# Patient Record
Sex: Female | Born: 1970 | Race: White | Hispanic: No | Marital: Married | Smoking: Former smoker
Health system: Southern US, Community
[De-identification: ages and names within clinical notes are randomized; demographics above are authoritative.]

## PROBLEM LIST (undated history)

## (undated) DIAGNOSIS — Z0000 Encounter for general adult medical examination without abnormal findings: Secondary | ICD-10-CM

## (undated) HISTORY — DX: Encounter for general adult medical examination without abnormal findings: Z00.00

## (undated) HISTORY — PX: AUGMENTATION MAMMAPLASTY: SUR837

## (undated) HISTORY — PX: WISDOM TOOTH EXTRACTION: SHX21

---

## 2000-02-10 ENCOUNTER — Other Ambulatory Visit: Admission: RE | Admit: 2000-02-10 | Discharge: 2000-02-10 | Payer: Self-pay | Admitting: Obstetrics & Gynecology

## 2000-08-27 ENCOUNTER — Inpatient Hospital Stay (HOSPITAL_COMMUNITY): Admission: AD | Admit: 2000-08-27 | Discharge: 2000-08-29 | Payer: Self-pay | Admitting: Obstetrics & Gynecology

## 2000-10-01 ENCOUNTER — Other Ambulatory Visit: Admission: RE | Admit: 2000-10-01 | Discharge: 2000-10-01 | Payer: Self-pay | Admitting: Obstetrics & Gynecology

## 2001-10-21 ENCOUNTER — Other Ambulatory Visit: Admission: RE | Admit: 2001-10-21 | Discharge: 2001-10-21 | Payer: Self-pay | Admitting: Obstetrics & Gynecology

## 2002-05-25 ENCOUNTER — Other Ambulatory Visit: Admission: RE | Admit: 2002-05-25 | Discharge: 2002-05-25 | Payer: Self-pay | Admitting: Obstetrics & Gynecology

## 2003-01-05 ENCOUNTER — Inpatient Hospital Stay (HOSPITAL_COMMUNITY): Admission: AD | Admit: 2003-01-05 | Discharge: 2003-01-07 | Payer: Self-pay | Admitting: Obstetrics & Gynecology

## 2003-02-10 ENCOUNTER — Other Ambulatory Visit: Admission: RE | Admit: 2003-02-10 | Discharge: 2003-02-10 | Payer: Self-pay | Admitting: Obstetrics & Gynecology

## 2013-02-20 ENCOUNTER — Ambulatory Visit (INDEPENDENT_AMBULATORY_CARE_PROVIDER_SITE_OTHER): Payer: BC Managed Care – PPO | Admitting: *Deleted

## 2013-02-20 DIAGNOSIS — Z23 Encounter for immunization: Secondary | ICD-10-CM

## 2016-12-05 ENCOUNTER — Ambulatory Visit (INDEPENDENT_AMBULATORY_CARE_PROVIDER_SITE_OTHER): Payer: BC Managed Care – PPO | Admitting: Obstetrics & Gynecology

## 2016-12-05 ENCOUNTER — Encounter: Payer: Self-pay | Admitting: Obstetrics & Gynecology

## 2016-12-05 VITALS — BP 124/82 | Ht 66.75 in | Wt 153.0 lb

## 2016-12-05 DIAGNOSIS — Z30431 Encounter for routine checking of intrauterine contraceptive device: Secondary | ICD-10-CM | POA: Diagnosis not present

## 2016-12-05 DIAGNOSIS — Z01419 Encounter for gynecological examination (general) (routine) without abnormal findings: Secondary | ICD-10-CM

## 2016-12-05 DIAGNOSIS — Z1151 Encounter for screening for human papillomavirus (HPV): Secondary | ICD-10-CM

## 2016-12-05 DIAGNOSIS — N898 Other specified noninflammatory disorders of vagina: Secondary | ICD-10-CM | POA: Diagnosis not present

## 2016-12-05 LAB — WET PREP FOR TRICH, YEAST, CLUE
Clue Cells Wet Prep HPF POC: NONE SEEN
Trich, Wet Prep: NONE SEEN
Yeast Wet Prep HPF POC: NONE SEEN

## 2016-12-05 NOTE — Addendum Note (Signed)
Addended by: Thurnell Garbe A on: 12/05/2016 11:57 AM   Modules accepted: Orders

## 2016-12-05 NOTE — Progress Notes (Signed)
Ashley Woodward 07/24/1970 [id number]   History:    46 y.o. G5P3A2 married.  Husband DM/Thrombophilia, had lower limb amputation.  Children Youngest 42, middle is a Paramedic in [employer], oldest in Nursing school.  RP:  Established patient presenting for annual gyn exam   HPI:  Well on Mirena IUD x 3 years.  No pelvic pain.  C/O mild increase in vaginal d/c with occasional odor and itching.  Not sexually active lately because husband has many medical issues.  Stress ++.  Breasts wnl.  Mictions/BMs wnl.  Past medical history,surgical history, family history and social history were all reviewed and documented in the EPIC chart.  Gynecologic History Patient's last menstrual period was 11/04/2016. Contraception: IUD Last Pap: 2015. Results were: normal Last mammogram: 2015. Results were: normal  Obstetric History OB History  Gravida Para Term Preterm AB Living  5 3     2 3   SAB TAB Ectopic Multiple Live Births               # Outcome Date GA Lbr Len/2nd Weight Sex Delivery Anes PTL Lv  5 AB           4 AB           3 Para           2 Para           1 Para                ROS: A ROS was performed and pertinent positives and negatives are included in the history.  GENERAL: No fevers or chills. HEENT: No change in vision, no earache, sore throat or sinus congestion. NECK: No pain or stiffness. CARDIOVASCULAR: No chest pain or pressure. No palpitations. PULMONARY: No shortness of breath, cough or wheeze. GASTROINTESTINAL: No abdominal pain, nausea, vomiting or diarrhea, melena or bright red blood per rectum. GENITOURINARY: No urinary frequency, urgency, hesitancy or dysuria. MUSCULOSKELETAL: No joint or muscle pain, no back pain, no recent trauma. DERMATOLOGIC: No rash, no itching, no lesions. ENDOCRINE: No polyuria, polydipsia, no heat or cold intolerance. No recent change in weight. HEMATOLOGICAL: No anemia or easy bruising or bleeding. NEUROLOGIC: No headache, seizures, numbness, tingling or  weakness. PSYCHIATRIC: No depression, no loss of interest in normal activity or change in sleep pattern.     Exam:   BP 124/82   Ht 5' 6.75" (1.695 m)   Wt 153 lb (69.4 kg)   LMP 11/04/2016 Comment: MIRENA IUD   BMI 24.14 kg/m   Body mass index is 24.14 kg/m.  General appearance : Well developed well nourished female. No acute distress HEENT: Eyes: no retinal hemorrhage or exudates,  Neck supple, trachea midline, no carotid bruits, no thyroidmegaly Lungs: Clear to auscultation, no rhonchi or wheezes, or rib retractions  Heart: Regular rate and rhythm, no murmurs or gallops Breast:Examined in sitting and supine position were symmetrical in appearance, no palpable masses or tenderness,  no skin retraction, no nipple inversion, no nipple discharge, no skin discoloration, no axillary or supraclavicular lymphadenopathy Abdomen: no palpable masses or tenderness, no rebound or guarding Extremities: no edema or skin discoloration or tenderness  Pelvic: Vulva normal.  Bartholin, Urethra, Skene Glands: Within normal limits             Vagina: No gross lesions.  Mild increase in discharge.  Wet prep done.  Cervix: No gross lesions or discharge.  Strings seen, good position. Pap/HPV done.  Uterus  AV, normal size, shape  and consistency, non-tender and mobile  Adnexa  Without masses or tenderness  Anus and perineum  normal     Assessment/Plan:  46 y.o. female for annual exam   1. Vaginal discharge Wet prep done, negative.  Reassured.  Will call for evaluation/treatment if Sxs of Yeasts or Bacterial Vaginosis.  2. Encounter for gynecological examination with Pap/HPV testing Gyn exam normal.  Wet prep neg.  Pap/HPV done.  Breasts wnl.  Schedule screening Mammo.  3. Encounter for routine checking of intrauterine contraceptive device (IUD) Well on Mirena IUD x 3 years, in good position.  Princess Bruins MD, 10:08 AM 12/05/2016

## 2016-12-05 NOTE — Addendum Note (Signed)
Addended by: Thurnell Garbe A on: 12/05/2016 11:54 AM   Modules accepted: Orders

## 2016-12-05 NOTE — Patient Instructions (Signed)
1. Encounter for gynecological examination with Pap/HPV testing Gyn exam normal.  Wet prep neg.  Pap/HPV done.  Breasts wnl.  Schedule screening Mammo.  2.  Encounter for routine checking of intrauterine contraceptive device (IUD) Well on Mirena IUD x 3 years, in good position.  3.  Vaginal discharge Wet prep done, negative.  Reassured.  Will call for evaluation/treatment if Sxs of Yeasts or Bacterial Vaginosis.  Raeann, it was a pleasure to see you today!  I will inform you of your results as soon as available.   Vaginitis Vaginitis is a condition in which the vaginal tissue swells and becomes red (inflamed). This condition is most often caused by a change in the normal balance of bacteria and yeast that live in the vagina. This change causes an overgrowth of certain bacteria or yeast, which causes the inflammation. There are different types of vaginitis, but the most common types are:  Bacterial vaginosis.  Yeast infection (candidiasis).  Trichomoniasis vaginitis. This is a sexually transmitted disease (STD).  Viral vaginitis.  Atrophic vaginitis.  Allergic vaginitis.  What are the causes? The cause of this condition depends on the type of vaginitis. It can be caused by:  Bacteria (bacterial vaginosis).  Yeast, which is a fungus (yeast infection).  A parasite (trichomoniasis vaginitis).  A virus (viral vaginitis).  Low hormone levels (atrophic vaginitis). Low hormone levels can occur during pregnancy, breastfeeding, or after menopause.  Irritants, such as bubble baths, scented tampons, and feminine sprays (allergic vaginitis).  Other factors can change the normal balance of the yeast and bacteria that live in the vagina. These include:  Antibiotic medicines.  Poor hygiene.  Diaphragms, vaginal sponges, spermicides, birth control pills, and intrauterine devices (IUD).  Sex.  Infection.  Uncontrolled diabetes.  A weakened defense (immune) system.  What  increases the risk? This condition is more likely to develop in women who:  Smoke.  Use vaginal douches, scented tampons, or scented sanitary pads.  Wear tight-fitting pants.  Wear thong underwear.  Use oral birth control pills or an IUD.  Have sex without a condom.  Have multiple sex partners.  Have an STD.  Frequently use the spermicide nonoxynol-9.  Eat lots of foods high in sugar.  Have uncontrolled diabetes.  Have low estrogen levels.  Have a weakened immune system from an immune disorder or medical treatment.  Are pregnant or breastfeeding.  What are the signs or symptoms? Symptoms vary depending on the cause of the vaginitis. Common symptoms include:  Abnormal vaginal discharge. ? The discharge is white, gray, or yellow with bacterial vaginosis. ? The discharge is thick, white, and cheesy with a yeast infection. ? The discharge is frothy and yellow or greenish with trichomoniasis.  A bad vaginal smell. The smell is fishy with bacterial vaginosis.  Vaginal itching, pain, or swelling.  Sex that is painful.  Pain or burning when urinating.  Sometimes there are no symptoms. How is this diagnosed? This condition is diagnosed based on your symptoms and medical history. A physical exam, including a pelvic exam, will also be done. You may also have other tests, including:  Tests to determine the pH level (acidity or alkalinity) of your vagina.  A whiff test, to assess the odor that results when a sample of your vaginal discharge is mixed with a potassium hydroxide solution.  Tests of vaginal fluid. A sample will be examined under a microscope.  How is this treated? Treatment varies depending on the type of vaginitis you have. Your treatment  may include:  Antibiotic creams or pills to treat bacterial vaginosis and trichomoniasis.  Antifungal medicines, such as vaginal creams or suppositories, to treat a yeast infection.  Medicine to ease discomfort if you  have viral vaginitis. Your sexual partner should also be treated.  Estrogen delivered in a cream, pill, suppository, or vaginal ring to treat atrophic vaginitis. If vaginal dryness occurs, lubricants and moisturizing creams may help. You may need to avoid scented soaps, sprays, or douches.  Stopping use of a product that is causing allergic vaginitis. Then using a vaginal cream to treat the symptoms.  Follow these instructions at home: Lifestyle  Keep your genital area clean and dry. Avoid soap, and only rinse the area with water.  Do not douche or use tampons until your health care provider says it is okay to do so. Use sanitary pads, if needed.  Do not have sex until your health care provider approves. When you can return to sex, practice safe sex and use condoms.  Wipe from front to back. This avoids the spread of bacteria from the rectum to the vagina. General instructions  Take over-the-counter and prescription medicines only as told by your health care provider.  If you were prescribed an antibiotic medicine, take or use it as told by your health care provider. Do not stop taking or using the antibiotic even if you start to feel better.  Keep all follow-up visits as told by your health care provider. This is important. How is this prevented?  Use mild, non-scented products. Do not use things that can irritate the vagina, such as fabric softeners. Avoid the following products if they are scented: ? Feminine sprays. ? Detergents. ? Tampons. ? Feminine hygiene products. ? Soaps or bubble baths.  Let air reach your genital area. ? Wear cotton underwear to reduce moisture buildup. ? Avoid wearing underwear while you sleep. ? Avoid wearing tight pants and underwear or nylons without a cotton panel. ? Avoid wearing thong underwear.  Take off any wet clothing, such as bathing suits, as soon as possible.  Practice safe sex and use condoms. Contact a health care provider  if:  You have abdominal pain.  You have a fever.  You have symptoms that last for more than 2-3 days. Get help right away if:  You have a fever and your symptoms suddenly get worse. Summary  Vaginitis is a condition in which the vaginal tissue becomes inflamed.This condition is most often caused by a change in the normal balance of bacteria and yeast that live in the vagina.  Treatment varies depending on the type of vaginitis you have.  Do not douche, use tampons , or have sex until your health care provider approves. When you can return to sex, practice safe sex and use condoms. This information is not intended to replace advice given to you by your health care provider. Make sure you discuss any questions you have with your health care provider. Document Released: 02/16/2007 Document Revised: 05/27/2016 Document Reviewed: 05/27/2016 Elsevier Interactive Patient Education  [employer].

## 2016-12-09 LAB — PAP, TP IMAGING W/ HPV RNA, RFLX HPV TYPE 16,18/45: HPV mRNA, High Risk: NOT DETECTED

## 2018-02-18 ENCOUNTER — Encounter: Payer: Self-pay | Admitting: Obstetrics & Gynecology

## 2018-02-18 ENCOUNTER — Ambulatory Visit (INDEPENDENT_AMBULATORY_CARE_PROVIDER_SITE_OTHER): Payer: BC Managed Care – PPO | Admitting: Obstetrics & Gynecology

## 2018-02-18 ENCOUNTER — Other Ambulatory Visit: Payer: Self-pay | Admitting: Obstetrics & Gynecology

## 2018-02-18 VITALS — BP 118/76 | Ht 66.0 in | Wt 146.0 lb

## 2018-02-18 DIAGNOSIS — Z30431 Encounter for routine checking of intrauterine contraceptive device: Secondary | ICD-10-CM | POA: Diagnosis not present

## 2018-02-18 DIAGNOSIS — Z01419 Encounter for gynecological examination (general) (routine) without abnormal findings: Secondary | ICD-10-CM

## 2018-02-18 DIAGNOSIS — Z1231 Encounter for screening mammogram for malignant neoplasm of breast: Secondary | ICD-10-CM

## 2018-02-18 NOTE — Progress Notes (Signed)
Ashley Woodward 1970/06/29 [id number]   History:    47 y.o. G5P3A2L3 Married.  Children 24 yo son, 83 yo son and 85 yo daughter  RP:  Established patient presenting for annual gyn exam   HPI: Well on Mirena IUD since April 2016.  No breakthrough bleeding.  No pelvic pain.  No pain with intercourse.  Normal vaginal secretions.  Urine and bowel movements normal.  Breasts normal, status post bilateral breast augmentation.  Body mass index 23.57. Fit and healthy nutrition.  Health labs with family physician.  Past medical history,surgical history, family history and social history were all reviewed and documented in the EPIC chart.  Gynecologic History Patient's last menstrual period was 02/14/2018. Contraception: Mirena IUD x 08/2014 Last Pap: 2018. Results were:  Negative, HPV HR negative Last mammogram: 2016. Results were: normal Bone Density: Never Colonoscopy: Never  Obstetric History OB History  Gravida Para Term Preterm AB Living  5 3     2 3   SAB TAB Ectopic Multiple Live Births               # Outcome Date GA Lbr Len/2nd Weight Sex Delivery Anes PTL Lv  5 AB           4 AB           3 Para           2 Para           1 Para              ROS: A ROS was performed and pertinent positives and negatives are included in the history.  GENERAL: No fevers or chills. HEENT: No change in vision, no earache, sore throat or sinus congestion. NECK: No pain or stiffness. CARDIOVASCULAR: No chest pain or pressure. No palpitations. PULMONARY: No shortness of breath, cough or wheeze. GASTROINTESTINAL: No abdominal pain, nausea, vomiting or diarrhea, melena or bright red blood per rectum. GENITOURINARY: No urinary frequency, urgency, hesitancy or dysuria. MUSCULOSKELETAL: No joint or muscle pain, no back pain, no recent trauma. DERMATOLOGIC: No rash, no itching, no lesions. ENDOCRINE: No polyuria, polydipsia, no heat or cold intolerance. No recent change in weight. HEMATOLOGICAL: No anemia or  easy bruising or bleeding. NEUROLOGIC: No headache, seizures, numbness, tingling or weakness. PSYCHIATRIC: No depression, no loss of interest in normal activity or change in sleep pattern.     Exam:   BP 118/76   Ht 5\' 6"  (1.676 m)   Wt 146 lb (66.2 kg)   LMP 02/14/2018 Comment: mirena  BMI 23.57 kg/m   Body mass index is 23.57 kg/m.  General appearance : Well developed well nourished female. No acute distress HEENT: Eyes: no retinal hemorrhage or exudates,  Neck supple, trachea midline, no carotid bruits, no thyroidmegaly Lungs: Clear to auscultation, no rhonchi or wheezes, or rib retractions  Heart: Regular rate and rhythm, no murmurs or gallops Breast:Examined in sitting and supine position were symmetrical in appearance, no palpable masses or tenderness,  no skin retraction, no nipple inversion, no nipple discharge, no skin discoloration, no axillary or supraclavicular lymphadenopathy Abdomen: no palpable masses or tenderness, no rebound or guarding Extremities: no edema or skin discoloration or tenderness  Pelvic: Vulva: Normal             Vagina: No gross lesions or discharge  Cervix: No gross lesions or discharge.  IUD strings visible.  Pap reflex done  Uterus  AV, normal size, shape and consistency, non-tender and mobile  Adnexa  Without masses or tenderness  Anus: Normal   Assessment/Plan:  47 y.o. female for annual exam   1. Encounter for routine gynecological examination with Papanicolaou smear of cervix Normal gynecologic exam.  Pap reflex done.  Breast exam normal.  Overdue for screening mammogram, will schedule at the breast center.  Health labs with family physician.  Good body mass index of 23.57.  Continue with healthy nutrition and fitness.  2. Encounter for routine checking of intrauterine contraceptive device (IUD) Well on Mirena IUD since April 2016.  Well-tolerated in good position.  Princess Bruins MD, 4:13 PM 02/18/2018

## 2018-02-19 ENCOUNTER — Encounter: Payer: Self-pay | Admitting: Obstetrics & Gynecology

## 2018-02-19 NOTE — Patient Instructions (Signed)
1. Encounter for routine gynecological examination with Papanicolaou smear of cervix Normal gynecologic exam.  Pap reflex done.  Breast exam normal.  Overdue for screening mammogram, will schedule at the breast center.  Health labs with family physician.  Good body mass index of 23.57.  Continue with healthy nutrition and fitness.  2. Encounter for routine checking of intrauterine contraceptive device (IUD) Well on Mirena IUD since April 2016.  Well-tolerated in good position.  Ashley Woodward, it was a pleasure seeing you today!  I will inform you of your results as soon as they are available.

## 2018-02-22 LAB — PAP IG W/ RFLX HPV ASCU

## 2018-03-31 ENCOUNTER — Ambulatory Visit
Admission: RE | Admit: 2018-03-31 | Discharge: 2018-03-31 | Disposition: A | Discharge status: Home / Self Care | Payer: BC Managed Care – PPO | Source: Ambulatory Visit | Attending: Obstetrics & Gynecology | Admitting: Obstetrics & Gynecology

## 2018-03-31 DIAGNOSIS — Z1231 Encounter for screening mammogram for malignant neoplasm of breast: Secondary | ICD-10-CM

## 2018-05-10 ENCOUNTER — Encounter: Payer: BC Managed Care – PPO | Admitting: Obstetrics & Gynecology

## 2018-12-13 ENCOUNTER — Ambulatory Visit
Admission: RE | Admit: 2018-12-13 | Discharge: 2018-12-13 | Disposition: A | Discharge status: Home / Self Care | Payer: No Typology Code available for payment source | Source: Ambulatory Visit | Attending: Nurse Practitioner | Admitting: Nurse Practitioner

## 2018-12-13 ENCOUNTER — Other Ambulatory Visit: Payer: Self-pay | Admitting: Nurse Practitioner

## 2018-12-13 DIAGNOSIS — Z021 Encounter for pre-employment examination: Secondary | ICD-10-CM

## 2018-12-13 LAB — CBC AND DIFFERENTIAL
HCT: 43 (ref 36–46)
Hemoglobin: 13.8 (ref 12.0–16.0)
Neutrophils Absolute: 4
WBC: 6.7

## 2018-12-13 LAB — LIPID PANEL
Cholesterol: 190 (ref 0–200)
Triglycerides: 61 (ref 40–160)

## 2018-12-13 LAB — HEPATIC FUNCTION PANEL
ALT: 13 (ref 7–35)
AST: 13 (ref 13–35)
Bilirubin, Total: 0.6

## 2018-12-13 LAB — BASIC METABOLIC PANEL
BUN: 8 (ref 4–21)
Creatinine: 0.7 (ref 0.5–1.1)
Glucose: 93
Potassium: 4.1 (ref 3.4–5.3)
Sodium: 140 (ref 137–147)

## 2018-12-13 LAB — TSH: TSH: 1.31 (ref 0.41–5.90)

## 2018-12-23 ENCOUNTER — Ambulatory Visit: Payer: BC Managed Care – PPO | Admitting: Family Medicine

## 2018-12-23 ENCOUNTER — Encounter: Payer: Self-pay | Admitting: Family Medicine

## 2018-12-23 VITALS — BP 126/82 | HR 77 | Temp 98.3°F | Ht 66.0 in | Wt 134.6 lb

## 2018-12-23 DIAGNOSIS — R3129 Other microscopic hematuria: Secondary | ICD-10-CM

## 2018-12-23 DIAGNOSIS — Z7689 Persons encountering health services in other specified circumstances: Secondary | ICD-10-CM | POA: Diagnosis not present

## 2018-12-23 DIAGNOSIS — M65331 Trigger finger, right middle finger: Secondary | ICD-10-CM

## 2018-12-23 DIAGNOSIS — Z23 Encounter for immunization: Secondary | ICD-10-CM | POA: Diagnosis not present

## 2018-12-23 NOTE — Progress Notes (Signed)
Ashley Woodward is a 48 y.o. female  Chief Complaint  Patient presents with  . Establish Care    est/ 2 times trace blood in urine/ right middle finger pain     HPI: Ashley Woodward is a 48 y.o. female here to establish care with our office.  She has 2 concerns today: 1. She has had trace microscopic hematuria on UA x 2 (09/2018 CPE and 12/2018 pre-hire physical) - done at walk-in clinic. No gross hematuria. No dysuria. No h/o kidney stones. No h/o UTI. fam h/o bladder cancer in [place]. 2. Rt hand middle finger pain, gets stuck at times. Pt has been doing exercises with minimal improvement. Symptoms about 1 year. No injury or trauma. No swelling.   Pt would like flu vaccine  Specialists: none  Last CPE, labs: 09/2018  Last PAP: 02/2018 - PAP - normal as per pt - Dr. Dellis Filbert Last mammo: 02/2018 - normal as per pt Last colonoscopy: will get referral for colonoscopy   Med refills needed today: none   No past medical history on file.  Past Surgical History:  Procedure Laterality Date  . AUGMENTATION MAMMAPLASTY      Social History   Socioeconomic History  . Marital status: Married    Spouse name: Not on file  . Number of children: Not on file  . Years of education: Not on file  . Highest education level: Not on file  Occupational History  . Not on file  Social Needs  . Financial resource strain: Not on file  . Food insecurity    Worry: Not on file    Inability: Not on file  . Transportation needs    Medical: Not on file    Non-medical: Not on file  Tobacco Use  . Smoking status: Former Smoker    Quit date: 12/06/1998    Years since quitting: 20.0  . Smokeless tobacco: Never Used  Substance and Sexual Activity  . Alcohol use: Yes    Alcohol/week: 6.0 standard drinks    Types: 6 Cans of beer per week  . Drug use: Not on file  . Sexual activity: Yes    Partners: Male    Birth control/protection: I.U.D.    Comment: 1st intercourse- 28, partners-   ?, married- 55 yrs    Lifestyle  . Physical activity    Days per week: Not on file    Minutes per session: Not on file  . Stress: Not on file  Relationships  . Social Herbalist on phone: Not on file    Gets together: Not on file    Attends religious service: Not on file    Active member of club or organization: Not on file    Attends meetings of clubs or organizations: Not on file    Relationship status: Not on file  . Intimate partner violence    Fear of current or ex partner: Not on file    Emotionally abused: Not on file    Physically abused: Not on file    Forced sexual activity: Not on file  Other Topics Concern  . Not on file  Social History Narrative  . Not on file    Family History  Problem Relation Age of Onset  . Cancer Maternal Grandmother        bladder  . Hypertension Maternal Grandmother   . Breast cancer Neg Hx      Immunization History  Administered Date(s) Administered  . Influenza,inj,Quad PF,6+ Mos  02/20/2013    No outpatient encounter medications on file as of 12/23/2018.   No facility-administered encounter medications on file as of 12/23/2018.      ROS: Pertinent positives and negatives noted in HPI. Remainder of ROS non-contributory    No Known Allergies  LMP 12/01/2018   Physical Exam  Constitutional: She appears well-developed and well-nourished. No distress.  Cardiovascular: Normal rate, regular rhythm and normal heart sounds.  No murmur heard. Pulmonary/Chest: Effort normal and breath sounds normal. No respiratory distress.  Abdominal: Soft. Bowel sounds are normal. There is no CVA tenderness.  Musculoskeletal:        General: No tenderness or edema.     Comments: Rt middle finger catches and gets stuck in flexion     A/P:  1. Encounter to establish care with new doctor - CPE and labs done in 12/2018 - will have pt sign records release form to get lab results  2. Microscopic hematuria - Ambulatory referral to Urology  3. Trigger  middle finger of right hand - Ambulatory referral to Sports Medicine  4. Need for influenza vaccination - Flu Vaccine QUAD 6+ mos PF IM (Fluarix Quad PF)

## 2018-12-24 ENCOUNTER — Encounter: Payer: Self-pay | Admitting: Family Medicine

## 2018-12-27 ENCOUNTER — Encounter: Payer: Self-pay | Admitting: Family Medicine

## 2018-12-27 ENCOUNTER — Ambulatory Visit: Payer: BC Managed Care – PPO | Admitting: Family Medicine

## 2018-12-27 ENCOUNTER — Ambulatory Visit: Payer: Self-pay

## 2018-12-27 ENCOUNTER — Other Ambulatory Visit: Payer: Self-pay

## 2018-12-27 VITALS — BP 122/80 | Ht 66.0 in | Wt 134.0 lb

## 2018-12-27 DIAGNOSIS — M65331 Trigger finger, right middle finger: Secondary | ICD-10-CM

## 2018-12-27 MED ORDER — TRIAMCINOLONE ACETONIDE 40 MG/ML IJ SUSP
40.0000 mg | Freq: Once | INTRAMUSCULAR | Status: AC
  Administered 2018-12-27: 40 mg via INTRA_ARTICULAR

## 2018-12-27 NOTE — Addendum Note (Signed)
Addended by: Sherrie George F on: 12/27/2018 03:05 PM   Modules accepted: Orders

## 2018-12-27 NOTE — Assessment & Plan Note (Signed)
Obvious triggering on exam today. -Injection today. -Provided splint and counseled on splinting over the next 3 to 6 weeks. -Could consider repeat injection.

## 2018-12-27 NOTE — Progress Notes (Signed)
Ashley Woodward - 48 y.o. female MRN [id number]  Date of birth: 03-15-1971  SUBJECTIVE:  Including CC & ROS.  Chief Complaint  Patient presents with  . Hand Pain    right middle finger    Ashley Woodward is a 48 y.o. female that is presenting with triggering of the right middle finger.  Is been ongoing for about 10 months.  The pain is becoming more constant.  She is having to open her finger with her other hand.  It is localized to the middle finger on the right hand.  It is occurring on the palmar aspect.  Denies any inciting event or trauma.  Denies any repetitive movements.  Has not had any improvement with modalities today.    Review of Systems  Constitutional: Negative for fever.  HENT: Negative for congestion.   Respiratory: Negative for cough.   Cardiovascular: Negative for chest pain.  Gastrointestinal: Negative for abdominal pain.  Musculoskeletal: Negative for back pain.  Neurological: Negative for weakness.  Hematological: Negative for adenopathy.    HISTORY: Past Medical, Surgical, Social, and Family History Reviewed & Updated per EMR.   Pertinent Historical Findings include:  No past medical history on file.  Past Surgical History:  Procedure Laterality Date  . AUGMENTATION MAMMAPLASTY      No Known Allergies  Family History  Problem Relation Age of Onset  . Cancer Maternal Grandmother        bladder  . Hypertension Maternal Grandmother   . Breast cancer Neg Hx      Social History   Socioeconomic History  . Marital status: Married    Spouse name: Not on file  . Number of children: Not on file  . Years of education: Not on file  . Highest education level: Not on file  Occupational History  . Not on file  Social Needs  . Financial resource strain: Not on file  . Food insecurity    Worry: Not on file    Inability: Not on file  . Transportation needs    Medical: Not on file    Non-medical: Not on file  Tobacco Use  . Smoking status: Former  Smoker    Quit date: 12/06/1998    Years since quitting: 20.0  . Smokeless tobacco: Never Used  Substance and Sexual Activity  . Alcohol use: Yes    Alcohol/week: 6.0 standard drinks    Types: 6 Cans of beer per week  . Drug use: Not on file  . Sexual activity: Yes    Partners: Male    Birth control/protection: I.U.D.    Comment: 1st intercourse- 37, partners-   ?, married- 29 yrs   Lifestyle  . Physical activity    Days per week: Not on file    Minutes per session: Not on file  . Stress: Not on file  Relationships  . Social Herbalist on phone: Not on file    Gets together: Not on file    Attends religious service: Not on file    Active member of club or organization: Not on file    Attends meetings of clubs or organizations: Not on file    Relationship status: Not on file  . Intimate partner violence    Fear of current or ex partner: Not on file    Emotionally abused: Not on file    Physically abused: Not on file    Forced sexual activity: Not on file  Other Topics Concern  . Not on  file  Social History Narrative  . Not on file     PHYSICAL EXAM:  VS: BP 122/80   Ht 5\' 6"  (1.676 m)   Wt 134 lb (60.8 kg)   LMP 12/01/2018   BMI 21.63 kg/m  Physical Exam Gen: NAD, alert, cooperative with exam, well-appearing ENT: normal lips, normal nasal mucosa,  Eye: normal EOM, normal conjunctiva and lids CV:  no edema, +2 pedal pulses   Resp: no accessory muscle use, non-labored,  Skin: no rashes, no areas of induration  Neuro: normal tone, normal sensation to touch Psych:  normal insight, alert and oriented MSK:  Right hand: Obvious triggering of the right middle finger. Some tenderness palpation over the flexor tendon over the A1 pulley. No obvious ecchymosis or swelling. Neurovascular intact   Aspiration/Injection Procedure Note Ashley Woodward 1971/01/07  Procedure: Injection Indications: Right middle finger trigger finger  Procedure Details Consent:  Risks of procedure as well as the alternatives and risks of each were explained to the (patient/caregiver).  Consent for procedure obtained. Time Out: Verified patient identification, verified procedure, site/side was marked, verified correct patient position, special equipment/implants available, medications/allergies/relevent history reviewed, required imaging and test results available.  Performed.  The area was cleaned with iodine and alcohol swabs.    The right middle finger trigger finger was injected using 1 cc's of 40 mg Kenalog and 1 cc's of 0.25% bupivacaine with a 25 1 1/2" needle.  Ultrasound was used. Images were obtained in long views showing the injection.     A sterile dressing was applied.  Patient did tolerate procedure well.       ASSESSMENT & PLAN:   Trigger finger, right middle finger Obvious triggering on exam today. -Injection today. -Provided splint and counseled on splinting over the next 3 to 6 weeks. -Could consider repeat injection.

## 2018-12-27 NOTE — Patient Instructions (Signed)
Nice to meet you Please try to splint the fingers at night for at least 3-6 weeks.   Please send me a message in MyChart with any questions or updates.  Please see me back in 4 weeks.   --Dr. Raeford Razor

## 2019-02-21 ENCOUNTER — Encounter: Payer: BC Managed Care – PPO | Admitting: Obstetrics & Gynecology

## 2019-03-03 ENCOUNTER — Other Ambulatory Visit: Payer: Self-pay

## 2019-03-04 ENCOUNTER — Ambulatory Visit: Payer: 59 | Admitting: Obstetrics & Gynecology

## 2019-03-04 ENCOUNTER — Encounter: Payer: Self-pay | Admitting: Obstetrics & Gynecology

## 2019-03-04 VITALS — BP 128/80 | Ht 66.0 in | Wt 130.0 lb

## 2019-03-04 DIAGNOSIS — Z30431 Encounter for routine checking of intrauterine contraceptive device: Secondary | ICD-10-CM

## 2019-03-04 DIAGNOSIS — Z01419 Encounter for gynecological examination (general) (routine) without abnormal findings: Secondary | ICD-10-CM | POA: Diagnosis not present

## 2019-03-04 NOTE — Progress Notes (Signed)
Ashley Woodward 1970-09-21 [id number]   History:    48 y.o. [id number] Married.  In training to become a Engineer, structural.  Children 101 yo son Engineer, structural, 63 yo son, 19 yo daughter.  RP:  Established patient presenting for annual gyn exam   HPI: Well on Mirena IUD since April 2016.  Light menses every month.  No breakthrough bleeding.  No pelvic pain.  No pain with intercourse.  Urine normal, but trace blood on the 3 last urine sample with family physician.  Referred to urology for evaluation.  Bowel movements normal.  Breast normal, status post bilateral augmentation.  BMI 20.98.  Very fit and healthy nutrition.  Health labs with Fam MD.  Past medical history,surgical history, family history and social history were all reviewed and documented in the EPIC chart.  Gynecologic History Patient's last menstrual period was 03/01/2019. Contraception: Mirena IUD x 08/2014 Last Pap: 02/2018. Results were: Negative Last mammogram: 03/2018. Results were: Negative Bone Density: Never Colonoscopy: Never.  Recommend Screening Colono this year.  Obstetric History OB History  Gravida Para Term Preterm AB Living  5 3     2 3   SAB TAB Ectopic Multiple Live Births               # Outcome Date GA Lbr Len/2nd Weight Sex Delivery Anes PTL Lv  5 AB           4 AB           3 Para           2 Para           1 Para              ROS: A ROS was performed and pertinent positives and negatives are included in the history.  GENERAL: No fevers or chills. HEENT: No change in vision, no earache, sore throat or sinus congestion. NECK: No pain or stiffness. CARDIOVASCULAR: No chest pain or pressure. No palpitations. PULMONARY: No shortness of breath, cough or wheeze. GASTROINTESTINAL: No abdominal pain, nausea, vomiting or diarrhea, melena or bright red blood per rectum. GENITOURINARY: No urinary frequency, urgency, hesitancy or dysuria. MUSCULOSKELETAL: No joint or muscle pain, no back pain, no recent trauma.  DERMATOLOGIC: No rash, no itching, no lesions. ENDOCRINE: No polyuria, polydipsia, no heat or cold intolerance. No recent change in weight. HEMATOLOGICAL: No anemia or easy bruising or bleeding. NEUROLOGIC: No headache, seizures, numbness, tingling or weakness. PSYCHIATRIC: No depression, no loss of interest in normal activity or change in sleep pattern.     Exam:   BP 128/80 (BP Location: Right Arm, Patient Position: Sitting, Cuff Size: Normal)   Ht 5\' 6"  (1.676 m)   Wt 130 lb (59 kg)   LMP 03/01/2019   BMI 20.98 kg/m   Body mass index is 20.98 kg/m.  General appearance : Well developed well nourished female. No acute distress HEENT: Eyes: no retinal hemorrhage or exudates,  Neck supple, trachea midline, no carotid bruits, no thyroidmegaly Lungs: Clear to auscultation, no rhonchi or wheezes, or rib retractions  Heart: Regular rate and rhythm, no murmurs or gallops Breast:Examined in sitting and supine position were symmetrical in appearance, no palpable masses or tenderness,  no skin retraction, no nipple inversion, no nipple discharge, no skin discoloration, no axillary or supraclavicular lymphadenopathy Abdomen: no palpable masses or tenderness, no rebound or guarding Extremities: no edema or skin discoloration or tenderness  Pelvic: Vulva: Normal  Vagina: No gross lesions or discharge  Cervix: No gross lesions or discharge.  IUD strings felt at the [facility].  Uterus  AV, normal size, shape and consistency, non-tender and mobile  Adnexa  Without masses or tenderness  Anus: Normal   Assessment/Plan:  48 y.o. female for annual exam   1. Well female exam with routine gynecological exam Normal gynecologic exam.  Pap test October 2019 was negative, no indication to repeat this year.  Breast exam status post bilateral augmentation was normal, screening mammogram November 2019 was negative.  Recommend a screening colonoscopy this year.  Health labs with family physician.  Very  good body mass index at 20.98.  Continue with fitness and healthy nutrition.  2. Encounter for routine checking of intrauterine contraceptive device (IUD) Lenda Kelp IUD in good position and well-tolerated since April 2016.  It will be time to change the [facility] IUD in April 2021.  Princess Bruins MD, 10:29 AM 03/04/2019

## 2019-03-04 NOTE — Patient Instructions (Addendum)
1. Well female exam with routine gynecological exam Normal gynecologic exam.  Pap test October 2019 was negative, no indication to repeat this year.  Breast exam status post bilateral augmentation was normal, screening mammogram November 2019 was negative.  Recommend a screening colonoscopy this year.  Health labs with family physician.  Very good body mass index at 20.98.  Continue with fitness and healthy nutrition.  2. Encounter for routine checking of intrauterine contraceptive device (IUD) Mirena IUD in good position and well-tolerated since April 2016.  It will be time to change the [facility] IUD in April 2021.  Ashley Woodward, it was a pleasure seeing you today!

## 2019-06-24 ENCOUNTER — Ambulatory Visit: Payer: Self-pay | Attending: Internal Medicine

## 2019-06-24 DIAGNOSIS — Z23 Encounter for immunization: Secondary | ICD-10-CM | POA: Insufficient documentation

## 2019-06-24 NOTE — Progress Notes (Signed)
   Covid-19 Vaccination Clinic  Name:  Ashley Woodward    MRN: [id number] DOB: 01-Sep-1970  06/24/2019  Ashley Woodward was observed post Covid-19 immunization for 15 minutes without incidence. She was provided with Vaccine Information Sheet and instruction to access the V-Safe system.   Ashley Woodward was instructed to call 911 with any severe reactions post vaccine: Marland Kitchen Difficulty breathing  . Swelling of your face and throat  . A fast heartbeat  . A bad rash all over your body  . Dizziness and weakness    Immunizations Administered    Name Date Dose VIS Date Route   Pfizer COVID-19 Vaccine 06/24/2019  2:57 PM 0.3 mL 04/15/2019 Intramuscular   Manufacturer: [place]   Lot: [id number]   [place]: [id number]

## 2019-07-19 ENCOUNTER — Ambulatory Visit: Payer: Self-pay | Attending: Internal Medicine

## 2019-07-19 DIAGNOSIS — Z23 Encounter for immunization: Secondary | ICD-10-CM

## 2019-07-19 NOTE — Progress Notes (Signed)
   Covid-19 Vaccination Clinic  Name:  Ashley Woodward    MRN: [id number] DOB: 17-Feb-1971  07/19/2019  [name] was observed post Covid-19 immunization for 15 minutes without incident. She was provided with Vaccine Information Sheet and instruction to access the V-Safe system.   [name] was instructed to call 911 with any severe reactions post vaccine: Marland Kitchen Difficulty breathing  . Swelling of face and throat  . A fast heartbeat  . A bad rash all over body  . Dizziness and weakness   Immunizations Administered    Name Date Dose VIS Date Route   Pfizer COVID-19 Vaccine 07/19/2019  8:28 AM 0.3 mL 04/15/2019 Intramuscular   Manufacturer: [place]   Lot: [id number]   [place]: [id number]

## 2019-10-11 ENCOUNTER — Other Ambulatory Visit: Payer: Self-pay

## 2019-10-12 ENCOUNTER — Encounter: Payer: Self-pay | Admitting: Family Medicine

## 2019-10-12 ENCOUNTER — Encounter: Payer: Self-pay | Admitting: Gastroenterology

## 2019-10-12 ENCOUNTER — Ambulatory Visit: Payer: 59 | Admitting: Family Medicine

## 2019-10-12 VITALS — BP 100/78 | HR 75 | Temp 97.6°F | Ht 66.0 in | Wt 132.4 lb

## 2019-10-12 DIAGNOSIS — R399 Unspecified symptoms and signs involving the genitourinary system: Secondary | ICD-10-CM

## 2019-10-12 DIAGNOSIS — Z1211 Encounter for screening for malignant neoplasm of colon: Secondary | ICD-10-CM

## 2019-10-12 LAB — POCT URINALYSIS DIPSTICK
Bilirubin, UA: NEGATIVE
Glucose, UA: NEGATIVE
Ketones, UA: NEGATIVE
Leukocytes, UA: NEGATIVE
Nitrite, UA: NEGATIVE
Protein, UA: NEGATIVE
Spec Grav, UA: 1.01 (ref 1.010–1.025)
Urobilinogen, UA: 0.2 E.U./dL
pH, UA: 7 (ref 5.0–8.0)

## 2019-10-12 NOTE — Progress Notes (Signed)
Ashley Woodward is a 49 y.o. female  Chief Complaint  Patient presents with  . Urinary Tract Infection    Pt c/o possible UTI, slight irratation and frequent urination, x 1week.  Pt need a referral for a colonoscopy    HPI: Ashley Woodward is a 49 y.o. female complains of mild irritation with urination and increased urinary frequency x 1 week. No gross hematuria. No urgency. She does have benign microscopic hematuria.   Fever, chills - no N/v - no Back pain - no Lower abdominal pain - no Vaginal discharge, itching, odor - no  Pt would like a referral for colonoscopy.   History reviewed. No pertinent past medical history.  Past Surgical History:  Procedure Laterality Date  . AUGMENTATION MAMMAPLASTY      Social History   Socioeconomic History  . Marital status: Married    Spouse name: Not on file  . Number of children: Not on file  . Years of education: Not on file  . Highest education level: Not on file  Occupational History  . Not on file  Tobacco Use  . Smoking status: Former Smoker    Quit date: 12/06/1998    Years since quitting: 20.8  . Smokeless tobacco: Never Used  Substance and Sexual Activity  . Alcohol use: Yes    Alcohol/week: 6.0 standard drinks    Types: 6 Cans of beer per week  . Drug use: Not Currently  . Sexual activity: Yes    Partners: Male    Birth control/protection: I.U.D., None    Comment: 1st intercourse- 57, partners-   ?, married- 67 yrs   Other Topics Concern  . Not on file  Social History Narrative  . Not on file   Social Determinants of Health   Financial Resource Strain:   . Difficulty of Paying Living Expenses:   Food Insecurity:   . Worried About Charity fundraiser in the Last Year:   . Arboriculturist in the Last Year:   Transportation Needs:   . Film/video editor (Medical):   Marland Kitchen Lack of Transportation (Non-Medical):   Physical Activity:   . Days of Exercise per Week:   . Minutes of Exercise per Session:     Stress:   . Feeling of Stress :   Social Connections:   . Frequency of Communication with Friends and Family:   . Frequency of Social Gatherings with Friends and Family:   . Attends Religious Services:   . Active Member of Clubs or Organizations:   . Attends Archivist Meetings:   Marland Kitchen Marital Status:   Intimate Partner Violence:   . Fear of Current or Ex-Partner:   . Emotionally Abused:   Marland Kitchen Physically Abused:   . Sexually Abused:     Family History  Problem Relation Age of Onset  . Cancer Maternal Grandmother        bladder  . Hypertension Maternal Grandmother   . Breast cancer Neg Hx      Immunization History  Administered Date(s) Administered  . Influenza Split 02/03/2012  . Influenza,inj,Quad PF,6+ Mos 02/20/2013, 12/23/2018  . PFIZER SARS-COV-2 Vaccination 06/24/2019, 07/19/2019  . Tdap 03/31/2017    No outpatient encounter medications on file as of 10/12/2019.   No facility-administered encounter medications on file as of 10/12/2019.     ROS: Pertinent positives and negatives noted in HPI. Remainder of ROS non-contributory   No Known Allergies  BP 100/78 (BP Location: Left Arm, Patient Position: Sitting,  Cuff Size: Normal)   Pulse 75   Temp 97.6 F (36.4 C) (Temporal)   Ht 5\' 6"  (1.676 m)   Wt 132 lb 6.4 oz (60.1 kg)   LMP  (LMP Unknown)   SpO2 98%   BMI 21.37 kg/m   Physical Exam  Constitutional: She is oriented to person, place, and time. She appears well-developed and well-nourished. No distress.  Abdominal: Soft. Bowel sounds are normal. She exhibits no distension. There is no abdominal tenderness. There is no CVA tenderness.  Neurological: She is alert and oriented to person, place, and time.  Psychiatric: She has a normal mood and affect. Her behavior is normal.   Component     Latest Ref Rng & Units 10/12/2019  Color, UA      Very ly yellow  Clarity, UA      Clear  Glucose     Negative Negative  Bilirubin, UA      Neg  Ketones,  UA      Neg  Specific Gravity, UA     1.010 - 1.025 1.010  RBC, UA      2+  pH, UA     5.0 - 8.0 7.0  Protein,UA     Negative Negative  Urobilinogen, UA     0.2 or 1.0 E.U./dL 0.2  Nitrite, UA      Neg  Leukocytes,UA     Negative Negative  Appearance      Clear    A/P:  1. UTI symptoms - doubt UTI - pt with known benign microscopic hematuria (eval by urology), otherwise UA negative/normal - pt has significantly increased her caffeine intake in the past 2 wks so recommended she limit caffeine, increase water intake - POCT Urinalysis Dipstick - f/u PRN  2. Screening for colon cancer - Ambulatory referral to Gastroenterology    This visit occurred during the SARS-CoV-2 public health emergency.  Safety protocols were in place, including screening questions prior to the visit, additional usage of staff PPE, and extensive cleaning of exam room while observing appropriate contact time as indicated for disinfecting solutions.

## 2019-11-23 ENCOUNTER — Ambulatory Visit (AMBULATORY_SURGERY_CENTER): Payer: Self-pay | Admitting: *Deleted

## 2019-11-23 ENCOUNTER — Other Ambulatory Visit: Payer: Self-pay

## 2019-11-23 ENCOUNTER — Encounter: Payer: Self-pay | Admitting: Gastroenterology

## 2019-11-23 VITALS — Ht 66.0 in | Wt 136.0 lb

## 2019-11-23 DIAGNOSIS — Z1211 Encounter for screening for malignant neoplasm of colon: Secondary | ICD-10-CM

## 2019-11-23 NOTE — Progress Notes (Signed)
07-19-19 comp cov vacc x 2   No egg or soy allergy known to patient  No issues with past sedation with any surgeries or procedures no intubation problems in the past  No diet pills per patient No home 02 use per patient  No blood thinners per patient  Pt denies issues with constipation  No A fib or A flutter  EMMI video to pt or MyChart  COVID 19 guidelines implemented in [place] today   Due to the COVID-19 pandemic we are asking patients to follow these guidelines. Please only bring one care partner. Please be aware that your care partner may wait in the car in the parking lot or if they feel like they will be too hot to wait in the car, they may wait in the lobby on the 4th floor. All care partners are required to wear a mask the entire time (we do not have any that we can provide them), they need to practice social distancing, and we will do a Covid check for all patient's and care partners when you arrive. Also we will check their temperature and your temperature. If the care partner waits in their car they need to stay in the parking lot the entire time and we will call them on their cell phone when the patient is ready for discharge so they can bring the car to the front of the building. Also all patient's will need to wear a mask into building.

## 2019-12-07 ENCOUNTER — Encounter: Payer: Self-pay | Admitting: Gastroenterology

## 2019-12-07 ENCOUNTER — Other Ambulatory Visit: Payer: Self-pay

## 2019-12-07 ENCOUNTER — Ambulatory Visit (AMBULATORY_SURGERY_CENTER): Payer: 59 | Admitting: Gastroenterology

## 2019-12-07 VITALS — BP 131/73 | HR 57 | Temp 96.9°F | Resp 11 | Ht 66.0 in | Wt 136.0 lb

## 2019-12-07 DIAGNOSIS — Z1211 Encounter for screening for malignant neoplasm of colon: Secondary | ICD-10-CM | POA: Diagnosis present

## 2019-12-07 DIAGNOSIS — D122 Benign neoplasm of ascending colon: Secondary | ICD-10-CM

## 2019-12-07 DIAGNOSIS — D123 Benign neoplasm of transverse colon: Secondary | ICD-10-CM

## 2019-12-07 MED ORDER — SODIUM CHLORIDE 0.9 % IV SOLN: 500.0000 mL | Freq: Once | INTRAVENOUS | Status: DC

## 2019-12-07 NOTE — Progress Notes (Signed)
pt tolerated well. VSS. awake and to recovery. Report given to RN.  

## 2019-12-07 NOTE — Progress Notes (Signed)
Pt's states no medical or surgical changes since previsit or office visit.  V/S-WR  Check-in-JB 

## 2019-12-07 NOTE — Patient Instructions (Signed)
Handout on plyps and diverticulosis given to you today   Await pathology results on polyps removed today   YOU HAD AN ENDOSCOPIC PROCEDURE TODAY AT [place]:   Refer to the procedure report that was given to you for any specific questions about what was found during the examination.  If the procedure report does not answer your questions, please call your gastroenterologist to clarify.  If you requested that your care partner not be given the details of your procedure findings, then the procedure report has been included in a sealed envelope for you to review at your convenience later.  YOU SHOULD EXPECT: Some feelings of bloating in the abdomen. Passage of more gas than usual.  Walking can help get rid of the air that was put into your GI tract during the procedure and reduce the bloating. If you had a lower endoscopy (such as a colonoscopy or flexible sigmoidoscopy) you may notice spotting of blood in your stool or on the toilet paper. If you underwent a bowel prep for your procedure, you may not have a normal bowel movement for a few days.  Please Note:  You might notice some irritation and congestion in your nose or some drainage.  This is from the oxygen used during your procedure.  There is no need for concern and it should clear up in a day or so.  SYMPTOMS TO REPORT IMMEDIATELY:   Following lower endoscopy (colonoscopy or flexible sigmoidoscopy):  Excessive amounts of blood in the stool  Significant tenderness or worsening of abdominal pains  Swelling of the abdomen that is new, acute  Fever of 100F or higher   Following upper endoscopy (EGD)  Vomiting of blood or coffee ground material  New chest pain or pain under the shoulder blades  Painful or persistently difficult swallowing  New shortness of breath  Fever of 100F or higher  Black, tarry-looking stools  For urgent or emergent issues, a gastroenterologist can be reached at any hour by calling [phone]  [phone]. Do not use MyChart messaging for urgent concerns.    DIET:  We do recommend a small meal at first, but then you may proceed to your regular diet.  Drink plenty of fluids but you should avoid alcoholic beverages for 24 hours.  ACTIVITY:  You should plan to take it easy for the rest of today and you should NOT DRIVE or use heavy machinery until tomorrow (because of the sedation medicines used during the test).    FOLLOW UP: Our staff will call the number listed on your records 48-72 hours following your procedure to check on you and address any questions or concerns that you may have regarding the information given to you following your procedure. If we do not reach you, we will leave a message.  We will attempt to reach you two times.  During this call, we will ask if you have developed any symptoms of COVID 19. If you develop any symptoms (ie: fever, flu-like symptoms, shortness of breath, cough etc.) before then, please call [phone].  If you test positive for Covid 19 in the 2 weeks post procedure, please call and report this information to [place].    If any biopsies were taken you will be contacted by phone or by letter within the next 1-3 weeks.  Please call us at [phone] if you have not heard about the biopsies in 3 weeks.    SIGNATURES/CONFIDENTIALITY: You and/or your care partner have signed paperwork which  will be entered into your electronic medical record.  These signatures attest to the fact that that the information above on your After Visit Summary has been reviewed and is understood.  Full responsibility of the confidentiality of this discharge information lies with you and/or your care-partner.

## 2019-12-07 NOTE — Op Note (Signed)
[facility] Patient Name: Ashley Woodward Procedure Date: 12/07/2019 8:24 AM MRN: [id number] Endoscopist: Mallie Mussel L. Loletha Carrow , MD Age: 49 Referring MD:  Date of Birth: 1970/07/09 Gender: Female Account #: [id number] Procedure:                Colonoscopy Indications:              Screening for colorectal malignant neoplasm, This                            is the patient's first colonoscopy Medicines:                Monitored Anesthesia Care Procedure:                Pre-Anesthesia Assessment:                           - Prior to the procedure, a History and Physical                            was performed, and patient medications and                            allergies were reviewed. The patient's tolerance of                            previous anesthesia was also reviewed. The risks                            and benefits of the procedure and the sedation                            options and risks were discussed with the patient.                            All questions were answered, and informed consent                            was obtained. Prior Anticoagulants: The patient has                            taken no previous anticoagulant or antiplatelet                            agents. ASA Grade Assessment: I - A normal, healthy                            patient. After reviewing the risks and benefits,                            the patient was deemed in satisfactory condition to                            undergo the procedure.  After obtaining informed consent, the colonoscope                            was passed under direct vision. Throughout the                            procedure, the patient's blood pressure, pulse, and                            oxygen saturations were monitored continuously. The                            Colonoscope was introduced through the anus and                            advanced to the the cecum, identified  by                            appendiceal orifice and ileocecal valve. The                            colonoscopy was performed without difficulty. The                            patient tolerated the procedure well. The quality                            of the bowel preparation was good after lavage                            (left colon prep initially fair due to                            diverticulosis). The ileocecal valve, appendiceal                            orifice, and rectum were photographed. The bowel                            preparation used was Miralax. Scope In: 8:33:53 AM Scope Out: 8:53:20 AM Scope Withdrawal Time: 0 hours 14 minutes 39 seconds  Total Procedure Duration: 0 hours 19 minutes 27 seconds  Findings:                 The perianal and digital rectal examinations were                            normal.                           Many diverticula were found in the left colon.                           Two sessile polyps were found in the transverse  colon and ascending colon. The polyps were                            diminutive in size. These polyps were removed with                            a cold snare. Resection and retrieval were complete.                           The exam was otherwise without abnormality on                            direct and retroflexion views. Complications:            No immediate complications. Estimated Blood Loss:     Estimated blood loss was minimal. Impression:               - Diverticulosis in the left colon.                           - Two diminutive polyps in the transverse colon and                            in the ascending colon, removed with a cold snare.                            Resected and retrieved.                           - The examination was otherwise normal on direct                            and retroflexion views. Recommendation:           - Patient has a contact number  available for                            emergencies. The signs and symptoms of potential                            delayed complications were discussed with the                            patient. Return to normal activities tomorrow.                            Written discharge instructions were provided to the                            patient.                           - Resume previous diet.                           - Continue present medications.                           -  Await pathology results.                           - Repeat colonoscopy is recommended for                            surveillance. The colonoscopy date will be                            determined after pathology results from today's                            exam become available for review. (Suprep or Plenvu                            for next exam) Krystin Keeven L. Loletha Carrow, MD 12/07/2019 8:57:52 AM This report has been signed electronically.

## 2019-12-07 NOTE — Progress Notes (Signed)
Called to room to assist during endoscopic procedure.  Patient ID and intended procedure confirmed with present staff. Received instructions for my participation in the procedure from the performing physician.  

## 2019-12-09 ENCOUNTER — Telehealth: Payer: Self-pay

## 2019-12-09 ENCOUNTER — Telehealth: Payer: Self-pay | Admitting: *Deleted

## 2019-12-09 ENCOUNTER — Encounter: Payer: Self-pay | Admitting: Gastroenterology

## 2019-12-09 NOTE — Telephone Encounter (Signed)
  Follow up Call-  Call back number 12/07/2019  Post procedure Call Back phone  # [phone]  Permission to leave phone message Yes  Some recent data might be hidden     Patient questions:  Do you have a fever, pain , or abdominal swelling? No. Pain Score  0 *  Have you tolerated food without any problems? No.  Have you been able to return to your normal activities? Yes.    Do you have any questions about your discharge instructions: Diet   No. Medications  No. Follow up visit  No.  Do you have questions or concerns about your Care? No.  Actions: * If pain score is 4 or above: No action needed, pain <4.  1. Have you developed a fever since your procedure? no  2.   Have you had an respiratory symptoms (SOB or cough) since your procedure? no  3.   Have you tested positive for COVID 19 since your procedure no  4.   Have you had any family members/close contacts diagnosed with the COVID 19 since your procedure?  no   If yes to any of these questions please route to Joylene John, RN and Erenest Rasher, RN

## 2019-12-09 NOTE — Telephone Encounter (Signed)
Attempted to reach pt. With follow-up call following endoscopic procedure 12/07/2019.  LM on pt. Voice mail.  Will try to reach pt. Again later today.

## 2020-05-29 ENCOUNTER — Encounter (HOSPITAL_BASED_OUTPATIENT_CLINIC_OR_DEPARTMENT_OTHER): Payer: Self-pay | Admitting: Emergency Medicine

## 2020-05-29 ENCOUNTER — Emergency Department (HOSPITAL_BASED_OUTPATIENT_CLINIC_OR_DEPARTMENT_OTHER)
Admission: EM | Admit: 2020-05-29 | Discharge: 2020-05-29 | Disposition: A | Discharge status: Home / Self Care | Payer: No Typology Code available for payment source | Attending: Emergency Medicine | Admitting: Emergency Medicine

## 2020-05-29 ENCOUNTER — Emergency Department (HOSPITAL_BASED_OUTPATIENT_CLINIC_OR_DEPARTMENT_OTHER): Payer: No Typology Code available for payment source

## 2020-05-29 ENCOUNTER — Other Ambulatory Visit: Payer: Self-pay

## 2020-05-29 DIAGNOSIS — Z87891 Personal history of nicotine dependence: Secondary | ICD-10-CM | POA: Insufficient documentation

## 2020-05-29 DIAGNOSIS — Y990 Civilian activity done for income or pay: Secondary | ICD-10-CM | POA: Diagnosis not present

## 2020-05-29 DIAGNOSIS — S6992XA Unspecified injury of left wrist, hand and finger(s), initial encounter: Secondary | ICD-10-CM | POA: Insufficient documentation

## 2020-05-29 DIAGNOSIS — X58XXXA Exposure to other specified factors, initial encounter: Secondary | ICD-10-CM | POA: Insufficient documentation

## 2020-05-29 DIAGNOSIS — S62629A Displaced fracture of medial phalanx of unspecified finger, initial encounter for closed fracture: Secondary | ICD-10-CM

## 2020-05-29 MED ORDER — IBUPROFEN 400 MG PO TABS
400.0000 mg | ORAL_TABLET | Freq: Once | ORAL | Status: AC
  Administered 2020-05-29: 400 mg via ORAL
  Filled 2020-05-29: qty 1

## 2020-05-29 NOTE — Discharge Instructions (Addendum)
Wear splint as applied for the next several weeks, then slowly reintroduce activity as tolerated.  Ice for 20 minutes every 2 hours while awake for the next 2 days to help with swelling.  Take ibuprofen 600 mg every 6 hours as needed for pain.

## 2020-05-29 NOTE — ED Triage Notes (Signed)
Pt with bruising and swelling to left pinky finger

## 2020-05-29 NOTE — ED Provider Notes (Signed)
[facility] EMERGENCY DEPARTMENT Provider Note   CSN: [id number] Arrival date & time: 05/29/20  0009     History Chief Complaint  Patient presents with  . Finger Injury    Ashley Woodward is a 50 y.o. female.  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  Patient was responding to a call that turned out to be a suicide.  She was assisting with accessing the house and injured her left pinky finger.  She denies numbness or tingling.  She has pain and swelling to the PIP joint.  The history is provided by the patient.       Past Medical History:  Diagnosis Date  . Healthy adult on routine physical examination     Patient Active Problem List   Diagnosis Date Noted  . Trigger finger, right middle finger 12/27/2018    Past Surgical History:  Procedure Laterality Date  . AUGMENTATION MAMMAPLASTY    . WISDOM TOOTH EXTRACTION     age 71      OB History    Gravida  5   Para  3   Term      Preterm      AB  2   Living  3     SAB      IAB      Ectopic      Multiple      Live Births              Family History  Problem Relation Age of Onset  . Cancer Maternal Grandmother        bladder  . Hypertension Maternal Grandmother   . Breast cancer Neg Hx   . Colon cancer Neg Hx   . Colon polyps Neg Hx   . Esophageal cancer Neg Hx   . Rectal cancer Neg Hx   . Stomach cancer Neg Hx     Social History   Tobacco Use  . Smoking status: Former Smoker    Quit date: 12/06/1998    Years since quitting: 21.4  . Smokeless tobacco: Never Used  Vaping Use  . Vaping Use: Never used  Substance Use Topics  . Alcohol use: Yes    Alcohol/week: 6.0 standard drinks    Types: 6 Cans of beer per week  . Drug use: Not Currently    Home Medications Prior to Admission medications   Not on File    Allergies    Patient has no known allergies.  Review of Systems   Review of Systems  All other systems reviewed and are  negative.   Physical Exam Updated Vital Signs BP (!) 170/109 (BP Location: Right Arm)   Pulse 70   Temp 98.2 F (36.8 C) (Oral)   Resp 18   Ht 5\' 6"  (1.676 m)   Wt 61.7 kg   SpO2 100%   BMI 21.95 kg/m   Physical Exam Vitals and nursing note reviewed.  Constitutional:      General: She is not in acute distress.    Appearance: Normal appearance. She is not ill-appearing.  HENT:     Head: Normocephalic and atraumatic.  Pulmonary:     Effort: Pulmonary effort is normal.  Musculoskeletal:     Comments: The left fifth finger has swelling and ecchymosis overlying the PIP joint.  She has full range of motion of the finger with no limitation.  She is able to flex and extend fully.  Skin:    General: Skin  is warm and dry.  Neurological:     Mental Status: She is alert.     ED Results / Procedures / Treatments   Labs (all labs ordered are listed, but only abnormal results are displayed) Labs Reviewed - No data to display  EKG None  Radiology DG Finger Little Left  Result Date: 05/29/2020 CLINICAL DATA:  Pain and bruising EXAM: LEFT LITTLE FINGER 2+V COMPARISON:  None. FINDINGS: There is soft tissue swelling about the digit. There is a questionable tiny avulsion fracture arising from the dorsal base of the middle phalanx best visualized on the lateral view. There is no dislocation. IMPRESSION: Questionable tiny avulsion fracture arising from the dorsal base of the middle phalanx. Soft tissue swelling. Electronically Signed   By: Constance Holster M.D.   On: 05/29/2020 00:54    Procedures Procedures   Medications Ordered in ED Medications  ibuprofen (ADVIL) tablet 400 mg (400 mg Oral Given 05/29/20 0023)    ED Course  I have reviewed the triage vital signs and the nursing notes.  Pertinent labs & imaging results that were available during my care of the patient were reviewed by me and considered in my medical decision making (see chart for details).    MDM  Rules/Calculators/A&P  Patient with small avulsion fracture of the PIP joint on x-ray.  Patient will be placed in a splint, advised to ice, rest and follow-up as needed.  There is no evidence for tendon injury as she has full range of motion with flexion and extension.  Final Clinical Impression(s) / ED Diagnoses Final diagnoses:  None    Rx / DC Orders ED Discharge Orders    None       Veryl Speak, MD 05/29/20 [phone]

## 2022-07-28 IMAGING — DX DG FINGER LITTLE 2+V*L*
3 series · 3 of 3 positions shown · non-contrast
Comparison: None.

CLINICAL DATA: Pain and bruising

EXAM:
LEFT LITTLE FINGER 2+V

[finger ap]
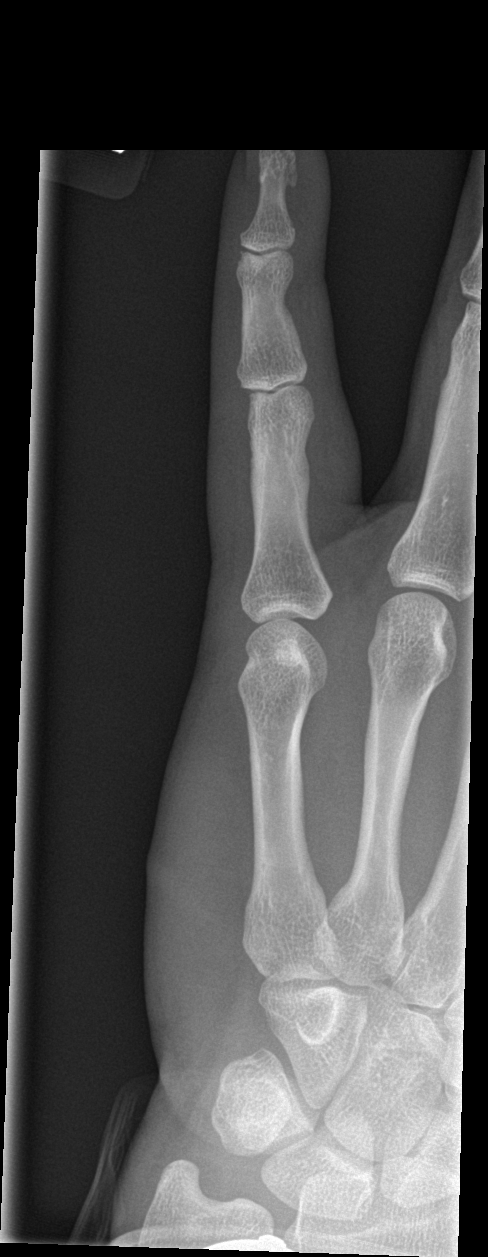

[finger obl]
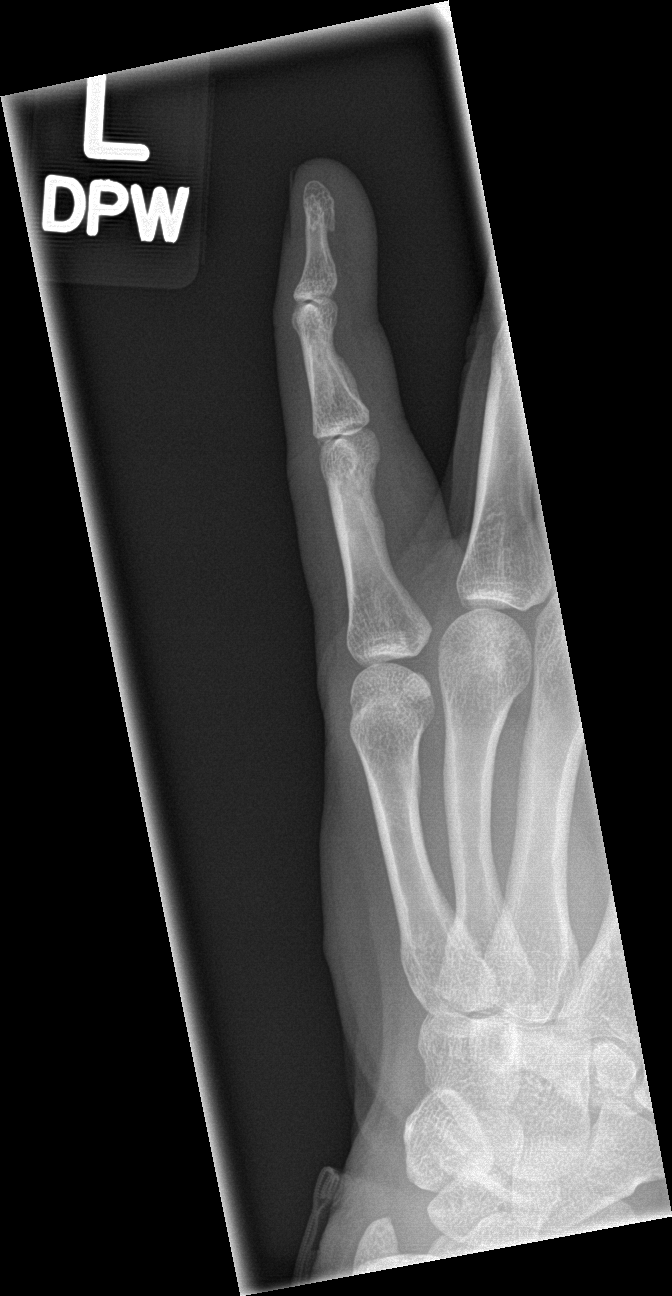

[finger lat]
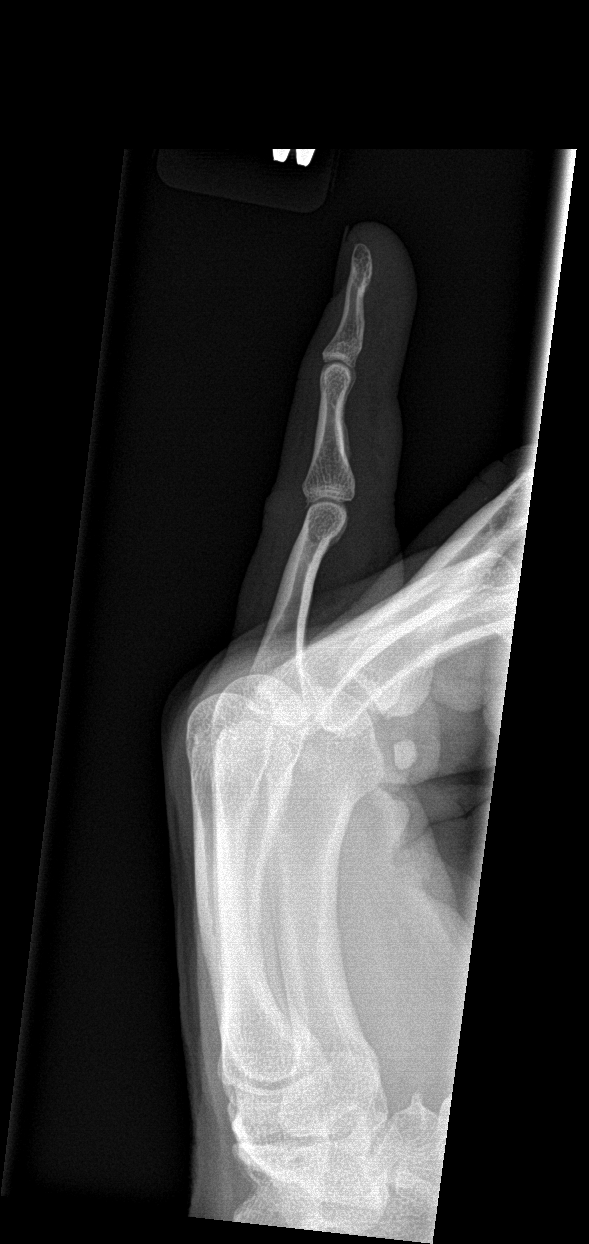

[3 of 3 positions shown; findings below may reference images not displayed]

FINDINGS: There is soft tissue swelling about the digit. There is a
questionable tiny avulsion fracture arising from the dorsal base of
the middle phalanx best visualized on the lateral view. There is no
dislocation.
IMPRESSION: Questionable tiny avulsion fracture arising from the dorsal base of
the middle phalanx.

Soft tissue swelling.

## 2022-08-18 ENCOUNTER — Encounter: Payer: Self-pay | Admitting: *Deleted

## 2023-11-03 ENCOUNTER — Ambulatory Visit
Admission: RE | Admit: 2023-11-03 | Discharge: 2023-11-03 | Disposition: A | Discharge status: Home / Self Care | Source: Ambulatory Visit | Attending: Internal Medicine | Admitting: Internal Medicine

## 2023-11-03 VITALS — BP 146/90 | HR 86 | Temp 98.8°F | Resp 16

## 2023-11-03 DIAGNOSIS — W57XXXA Bitten or stung by nonvenomous insect and other nonvenomous arthropods, initial encounter: Secondary | ICD-10-CM | POA: Diagnosis not present

## 2023-11-03 DIAGNOSIS — S00461A Insect bite (nonvenomous) of right ear, initial encounter: Secondary | ICD-10-CM

## 2023-11-03 MED ORDER — DOXYCYCLINE HYCLATE 100 MG PO CAPS: 100.0000 mg | ORAL_CAPSULE | Freq: Two times a day (BID) | ORAL | 0 refills | Status: AC

## 2023-11-03 MED ORDER — MUPIROCIN 2 % EX OINT: 1.0000 | TOPICAL_OINTMENT | Freq: Two times a day (BID) | CUTANEOUS | 0 refills | Status: AC

## 2023-11-03 NOTE — Discharge Instructions (Signed)
 Take doxycycline antibiotic every 12 hours for the next 10 days as prescribed.  Apply mupirocin ointment to the tick bite every 12 hours for the next 7 days.  Watch for new/worsening signs of infection such as redness, swelling, pus drainage, or worsening pain to the ear.  Continue taking ibuprofen  600 mg every 6 hours as needed for pain and swelling.  Follow-up with PCP as needed.

## 2023-11-03 NOTE — ED Triage Notes (Signed)
 Pt c/o right ear and lymph node swelling after a tick bite. Bite occurred around Sunday. She also had bite on right side of neck a few weeks ago.  She has been having trouble sleeping and is taking ibuprofen  and Tylenol at home

## 2023-11-03 NOTE — ED Provider Notes (Signed)
 [facility]    CSN: [id number] Arrival date & time: 11/03/23  1431      History   Chief Complaint Chief Complaint  Patient presents with   Ear Injury    Swollen painful lymph node, right ear tick bite - Entered by patient    HPI Ashley Woodward is a 53 y.o. female.   Ashley Woodward is a 53 y.o. female presenting for chief complaint of tick bite of right ear that she first noticed 2 days ago on Sunday November 01, 2023. She was recently clearing brush at a friend's farm and she suspects this is where the tick came from. She was able to remove the tick and is now having pain and swelling of the right posterior earlobe. She has seen clear/yellow drainage from the wound. Denies recent fever, chills, nausea, vomiting, diarrhea, body aches, abdominal pain, and dizziness. No drainage or pan to the internal right ear. Reports tender and swollen palpable lymph nodes to the right neck after tick bite. Denies recent antibiotic/steroid use and history of immunosuppression. She is taking ibuprofen  with some relief of pain/swelling to the right earlobe.      Past Medical History:  Diagnosis Date   Healthy adult on routine physical examination     Patient Active Problem List   Diagnosis Date Noted   Trigger finger, right middle finger 12/27/2018    Past Surgical History:  Procedure Laterality Date   AUGMENTATION MAMMAPLASTY     WISDOM TOOTH EXTRACTION     age 75     OB History     Gravida  5   Para  3   Term      Preterm      AB  2   Living  3      SAB      IAB      Ectopic      Multiple      Live Births               Home Medications    Prior to Admission medications   Medication Sig Start Date End Date Taking? Authorizing Provider  doxycycline (VIBRAMYCIN) 100 MG capsule Take 1 capsule (100 mg total) by mouth 2 (two) times daily. 11/03/23  Yes Enedelia Dorna HERO, FNP  mupirocin ointment (BACTROBAN) 2 % Apply 1 Application topically 2 (two)  times daily. 11/03/23  Yes StanhopeDorna HERO, FNP    Family History Family History  Problem Relation Age of Onset   Cancer Maternal Grandmother        bladder   Hypertension Maternal Grandmother    Breast cancer Neg Hx    Colon cancer Neg Hx    Colon polyps Neg Hx    Esophageal cancer Neg Hx    Rectal cancer Neg Hx    Stomach cancer Neg Hx     Social History Social History   Tobacco Use   Smoking status: Former    Current packs/day: 0.00    Types: Cigarettes    Quit date: 12/06/1998    Years since quitting: 24.9   Smokeless tobacco: Never  Vaping Use   Vaping status: Never Used  Substance Use Topics   Alcohol use: Yes    Alcohol/week: 6.0 standard drinks of alcohol    Types: 6 Cans of beer per week   Drug use: Not Currently     Allergies   Patient has no known allergies.   Review of Systems Review of Systems Per HPI  Physical  Exam Triage Vital Signs ED Triage Vitals  Encounter Vitals Group     BP 11/03/23 1439 (!) 146/90     Girls Systolic BP Percentile --      Girls Diastolic BP Percentile --      Boys Systolic BP Percentile --      Boys Diastolic BP Percentile --      Pulse Rate 11/03/23 1439 86     Resp 11/03/23 1439 16     Temp 11/03/23 1439 98.8 F (37.1 C)     Temp Source 11/03/23 1439 Oral     SpO2 11/03/23 1439 96 %     Weight --      Height --      Head Circumference --      Peak Flow --      Pain Score 11/03/23 1443 7     Pain Loc --      Pain Education --      Exclude from Growth Chart --    No data found.  Updated Vital Signs BP (!) 146/90 (BP Location: Right Arm)   Pulse 86   Temp 98.8 F (37.1 C) (Oral)   Resp 16   SpO2 96%   Visual Acuity Right Eye Distance:   Left Eye Distance:   Bilateral Distance:    Right Eye Near:   Left Eye Near:    Bilateral Near:     Physical Exam Vitals and nursing note reviewed.  Constitutional:      Appearance: She is not ill-appearing or toxic-appearing.  HENT:     Head:  Normocephalic and atraumatic.     Right Ear: Hearing, tympanic membrane and ear canal normal.     Left Ear: Hearing, tympanic membrane, ear canal and external ear normal.     Ears:     Comments: Erythematous pinpoint papular lesion to the posterior right earlobe. Scant yellow/white drainage present. Minimal soft tissue swelling. No swelling of the cartilage.    Nose: Nose normal.     Mouth/Throat:     Lips: Pink.   Eyes:     Woodward: Lids are normal. Vision grossly intact. Gaze aligned appropriately.     Extraocular Movements: Extraocular movements intact.     Conjunctiva/sclera: Conjunctivae normal.   Pulmonary:     Effort: Pulmonary effort is normal.   Musculoskeletal:     Cervical back: Neck supple.   Skin:    Woodward: Skin is warm and dry.     Capillary Refill: Capillary refill takes less than 2 seconds.     Findings: No rash.   Neurological:     Woodward: No focal deficit present.     Mental Status: She is alert and oriented to person, place, and time. Mental status is at baseline.     Cranial Nerves: No dysarthria or facial asymmetry.   Psychiatric:        Mood and Affect: Mood normal.        Speech: Speech normal.        Behavior: Behavior normal.        Thought Content: Thought content normal.        Judgment: Judgment normal.      UC Treatments / Results  Labs (all labs ordered are listed, but only abnormal results are displayed) Labs Reviewed - No data to display  EKG   Radiology No results found.  Procedures Procedures (including critical care time)  Medications Ordered in UC Medications - No data to display  Initial Impression /  Assessment and Plan / UC Course  I have reviewed the triage vital signs and the nursing notes.  Pertinent labs & imaging results that were available during my care of the patient were reviewed by me and considered in my medical decision making (see chart for details).   1. Tick bite of right ear Presentation is  suspicious for tick borne illness.  We'll treat empirically with doxycycline BID for 10 days.  Topical mupirocin ointment for further antibacterial coverage.  Shared decision making used to defer testing for RMSF, lyme disease, and Ehrlichia as this will not change our treatment plan. Infection return precautions discussed.   Counseled patient on potential for adverse effects with medications prescribed/recommended today, strict ER and return-to-clinic precautions discussed, patient verbalized understanding.    Final Clinical Impressions(s) / UC Diagnoses   Final diagnoses:  Tick bite of right ear, initial encounter     Discharge Instructions      Take doxycycline antibiotic every 12 hours for the next 10 days as prescribed.  Apply mupirocin ointment to the tick bite every 12 hours for the next 7 days.  Watch for new/worsening signs of infection such as redness, swelling, pus drainage, or worsening pain to the ear.  Continue taking ibuprofen  600 mg every 6 hours as needed for pain and swelling.  Follow-up with PCP as needed.     ED Prescriptions     Medication Sig Dispense Auth. Provider   doxycycline (VIBRAMYCIN) 100 MG capsule Take 1 capsule (100 mg total) by mouth 2 (two) times daily. 20 capsule Serjio Deupree M, FNP   mupirocin ointment (BACTROBAN) 2 % Apply 1 Application topically 2 (two) times daily. 22 g Enedelia Dorna HERO, FNP      PDMP not reviewed this encounter.   Enedelia Dorna HERO, [place] 11/03/23 [phone]
